# Patient Record
Sex: Male | Born: 1985 | Race: White | Hispanic: No | Marital: Single | Smoking: Never smoker
Health system: Southern US, Community
[De-identification: ages and names within clinical notes are randomized; demographics above are authoritative.]

## PROBLEM LIST (undated history)

## (undated) DIAGNOSIS — F32A Depression, unspecified: Secondary | ICD-10-CM

## (undated) DIAGNOSIS — F329 Major depressive disorder, single episode, unspecified: Secondary | ICD-10-CM

## (undated) HISTORY — PX: TONSILLECTOMY: SUR1361

---

## 2005-07-16 ENCOUNTER — Emergency Department (HOSPITAL_COMMUNITY): Admission: EM | Admit: 2005-07-16 | Discharge: 2005-07-16 | Payer: Self-pay | Admitting: Emergency Medicine

## 2006-06-09 ENCOUNTER — Emergency Department (HOSPITAL_COMMUNITY): Admission: EM | Admit: 2006-06-09 | Discharge: 2006-06-09 | Payer: Self-pay | Admitting: Emergency Medicine

## 2007-03-13 ENCOUNTER — Emergency Department: Payer: Self-pay | Admitting: Internal Medicine

## 2007-03-18 ENCOUNTER — Emergency Department (HOSPITAL_COMMUNITY): Admission: EM | Admit: 2007-03-18 | Discharge: 2007-03-19 | Payer: Self-pay | Admitting: Emergency Medicine

## 2007-11-09 ENCOUNTER — Ambulatory Visit: Payer: Self-pay | Admitting: Internal Medicine

## 2008-05-10 ENCOUNTER — Emergency Department: Payer: Self-pay | Admitting: Emergency Medicine

## 2008-05-14 ENCOUNTER — Emergency Department: Payer: Self-pay | Admitting: Emergency Medicine

## 2008-05-22 ENCOUNTER — Ambulatory Visit: Payer: Self-pay | Admitting: Internal Medicine

## 2011-06-06 ENCOUNTER — Emergency Department (HOSPITAL_COMMUNITY)
Admission: EM | Admit: 2011-06-06 | Discharge: 2011-06-06 | Disposition: A | Discharge status: Home / Self Care | Payer: No Typology Code available for payment source | Attending: Emergency Medicine | Admitting: Emergency Medicine

## 2011-06-06 ENCOUNTER — Encounter (HOSPITAL_COMMUNITY): Payer: Self-pay | Admitting: *Deleted

## 2011-06-06 ENCOUNTER — Emergency Department (HOSPITAL_COMMUNITY): Payer: No Typology Code available for payment source

## 2011-06-06 ENCOUNTER — Emergency Department (HOSPITAL_COMMUNITY): Payer: Self-pay

## 2011-06-06 DIAGNOSIS — Y92410 Unspecified street and highway as the place of occurrence of the external cause: Secondary | ICD-10-CM | POA: Insufficient documentation

## 2011-06-06 DIAGNOSIS — S39012A Strain of muscle, fascia and tendon of lower back, initial encounter: Secondary | ICD-10-CM

## 2011-06-06 DIAGNOSIS — S0003XA Contusion of scalp, initial encounter: Secondary | ICD-10-CM | POA: Insufficient documentation

## 2011-06-06 DIAGNOSIS — M542 Cervicalgia: Secondary | ICD-10-CM | POA: Insufficient documentation

## 2011-06-06 DIAGNOSIS — S161XXA Strain of muscle, fascia and tendon at neck level, initial encounter: Secondary | ICD-10-CM

## 2011-06-06 DIAGNOSIS — S335XXA Sprain of ligaments of lumbar spine, initial encounter: Secondary | ICD-10-CM | POA: Insufficient documentation

## 2011-06-06 DIAGNOSIS — S0990XA Unspecified injury of head, initial encounter: Secondary | ICD-10-CM | POA: Insufficient documentation

## 2011-06-06 MED ORDER — MORPHINE SULFATE 4 MG/ML IJ SOLN
6.0000 mg | Freq: Once | INTRAMUSCULAR | Status: AC
  Administered 2011-06-06: 6 mg via INTRAVENOUS

## 2011-06-06 MED ORDER — HYDROCODONE-ACETAMINOPHEN 5-500 MG PO TABS: 1.0000 | ORAL_TABLET | Freq: Four times a day (QID) | ORAL | Status: AC | PRN

## 2011-06-06 MED ORDER — MORPHINE SULFATE 4 MG/ML IJ SOLN
6.0000 mg | Freq: Once | INTRAMUSCULAR | Status: DC
  Filled 2011-06-06: qty 1

## 2011-06-06 MED ORDER — MORPHINE SULFATE 2 MG/ML IJ SOLN
INTRAMUSCULAR | Status: AC
  Filled 2011-06-06: qty 1

## 2011-06-06 MED ORDER — KETOROLAC TROMETHAMINE 30 MG/ML IJ SOLN
30.0000 mg | Freq: Once | INTRAMUSCULAR | Status: DC
  Filled 2011-06-06: qty 1

## 2011-06-06 MED ORDER — MORPHINE SULFATE 4 MG/ML IJ SOLN
6.0000 mg | Freq: Once | INTRAMUSCULAR | Status: AC
  Administered 2011-06-06: 6 mg via INTRAVENOUS
  Filled 2011-06-06: qty 2

## 2011-06-06 MED ORDER — TETANUS-DIPHTHERIA TOXOIDS TD 5-2 LFU IM INJ
0.5000 mL | INJECTION | Freq: Once | INTRAMUSCULAR | Status: AC
  Administered 2011-06-06: 0.5 mL via INTRAMUSCULAR
  Filled 2011-06-06: qty 0.5

## 2011-06-06 MED ORDER — KETOROLAC TROMETHAMINE 30 MG/ML IJ SOLN
30.0000 mg | Freq: Once | INTRAMUSCULAR | Status: AC
  Administered 2011-06-06: 30 mg via INTRAVENOUS

## 2011-06-06 MED ORDER — ONDANSETRON HCL 4 MG/2ML IJ SOLN
4.0000 mg | Freq: Once | INTRAMUSCULAR | Status: AC
  Administered 2011-06-06: 4 mg via INTRAVENOUS
  Filled 2011-06-06: qty 2

## 2011-06-06 MED ORDER — DIAZEPAM 5 MG PO TABS
5.0000 mg | ORAL_TABLET | Freq: Once | ORAL | Status: AC
  Administered 2011-06-06: 5 mg via ORAL
  Filled 2011-06-06: qty 1

## 2011-06-06 MED ORDER — ONDANSETRON 4 MG PO TBDP
4.0000 mg | ORAL_TABLET | Freq: Once | ORAL | Status: DC
  Administered 2011-06-06: 4 mg via ORAL
  Filled 2011-06-06: qty 1

## 2011-06-06 NOTE — ED Notes (Signed)
Drink and crackers given upon request
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
## 2011-06-06 NOTE — ED Notes (Signed)
SHP in with patient at this time

## 2011-06-06 NOTE — ED Notes (Signed)
Sat pt up in bed. Pt stated that his nausea and dizziness came back.

## 2011-06-06 NOTE — ED Notes (Signed)
Pt refuses to ambulate. States he does not feel it will be a good.
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
## 2011-06-06 NOTE — ED Notes (Signed)
Pt ambulated to bathroom, Pt stated he is sore Ambulated with no assistance.
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
## 2011-06-07 NOTE — ED Provider Notes (Signed)
Medical screening examination/treatment/procedure(s) were conducted as a shared visit with non-physician practitioner(s) and myself.  I personally evaluated the patient during the encounter  Olivia Mackie, MD 06/07/11 [phone]

## 2012-09-18 ENCOUNTER — Emergency Department: Payer: Self-pay | Admitting: Emergency Medicine

## 2012-11-26 ENCOUNTER — Emergency Department: Payer: Self-pay | Admitting: Emergency Medicine

## 2012-11-26 LAB — BASIC METABOLIC PANEL
BUN: 11 mg/dL (ref 7–18)
Glucose: 108 mg/dL — ABNORMAL HIGH (ref 65–99)
Osmolality: 277 (ref 275–301)
Potassium: 3.5 mmol/L (ref 3.5–5.1)

## 2012-11-26 LAB — CBC
HGB: 14.6 g/dL (ref 13.0–18.0)
MCHC: 33.7 g/dL (ref 32.0–36.0)
RBC: 4.9 10*6/uL (ref 4.40–5.90)
WBC: 10.4 10*3/uL (ref 3.8–10.6)

## 2012-11-27 LAB — HEPATIC FUNCTION PANEL A (ARMC)
Albumin: 4.1 g/dL (ref 3.4–5.0)
Bilirubin, Direct: <0.1 mg/dL (ref 0.00–0.20)
Bilirubin,Total: 0.2 mg/dL (ref 0.2–1.0)
Total Protein: 7.9 g/dL (ref 6.4–8.2)

## 2012-11-27 LAB — LIPASE, BLOOD: Lipase: 148 U/L (ref 73–393)

## 2013-02-16 ENCOUNTER — Emergency Department: Payer: Self-pay | Admitting: Emergency Medicine

## 2013-02-16 LAB — RAPID INFLUENZA A&B ANTIGENS (ARMC ONLY)

## 2013-03-19 ENCOUNTER — Emergency Department: Payer: Self-pay | Admitting: Emergency Medicine

## 2013-05-28 ENCOUNTER — Emergency Department: Payer: Self-pay | Admitting: Emergency Medicine

## 2013-06-17 ENCOUNTER — Emergency Department: Payer: Self-pay | Admitting: Emergency Medicine

## 2013-09-30 ENCOUNTER — Emergency Department: Payer: Self-pay | Admitting: Emergency Medicine

## 2013-10-05 ENCOUNTER — Emergency Department: Payer: Self-pay | Admitting: Emergency Medicine

## 2014-01-01 ENCOUNTER — Emergency Department: Payer: Self-pay | Admitting: Emergency Medicine

## 2014-08-15 IMAGING — CR DG CHEST 2V
1 series · 3 of 3 positions shown · non-contrast
Comparison: none

REASON FOR EXAM: Chest Pain
COMMENTS:

PROCEDURE:     DXR - DXR CHEST PA (OR AP) AND LATERAL  - November 26, 2012  [DATE]
RESULT:     The lungs are clear. The heart and pulmonary vessels are normal.
The bony and mediastinal structures are unremarkable. There is no effusion.
There is no pneumothorax or evidence of congestive failure.

[Series 1: pa · 0.17mm/px · 3 of 3 slices shown]
[im 1/3]
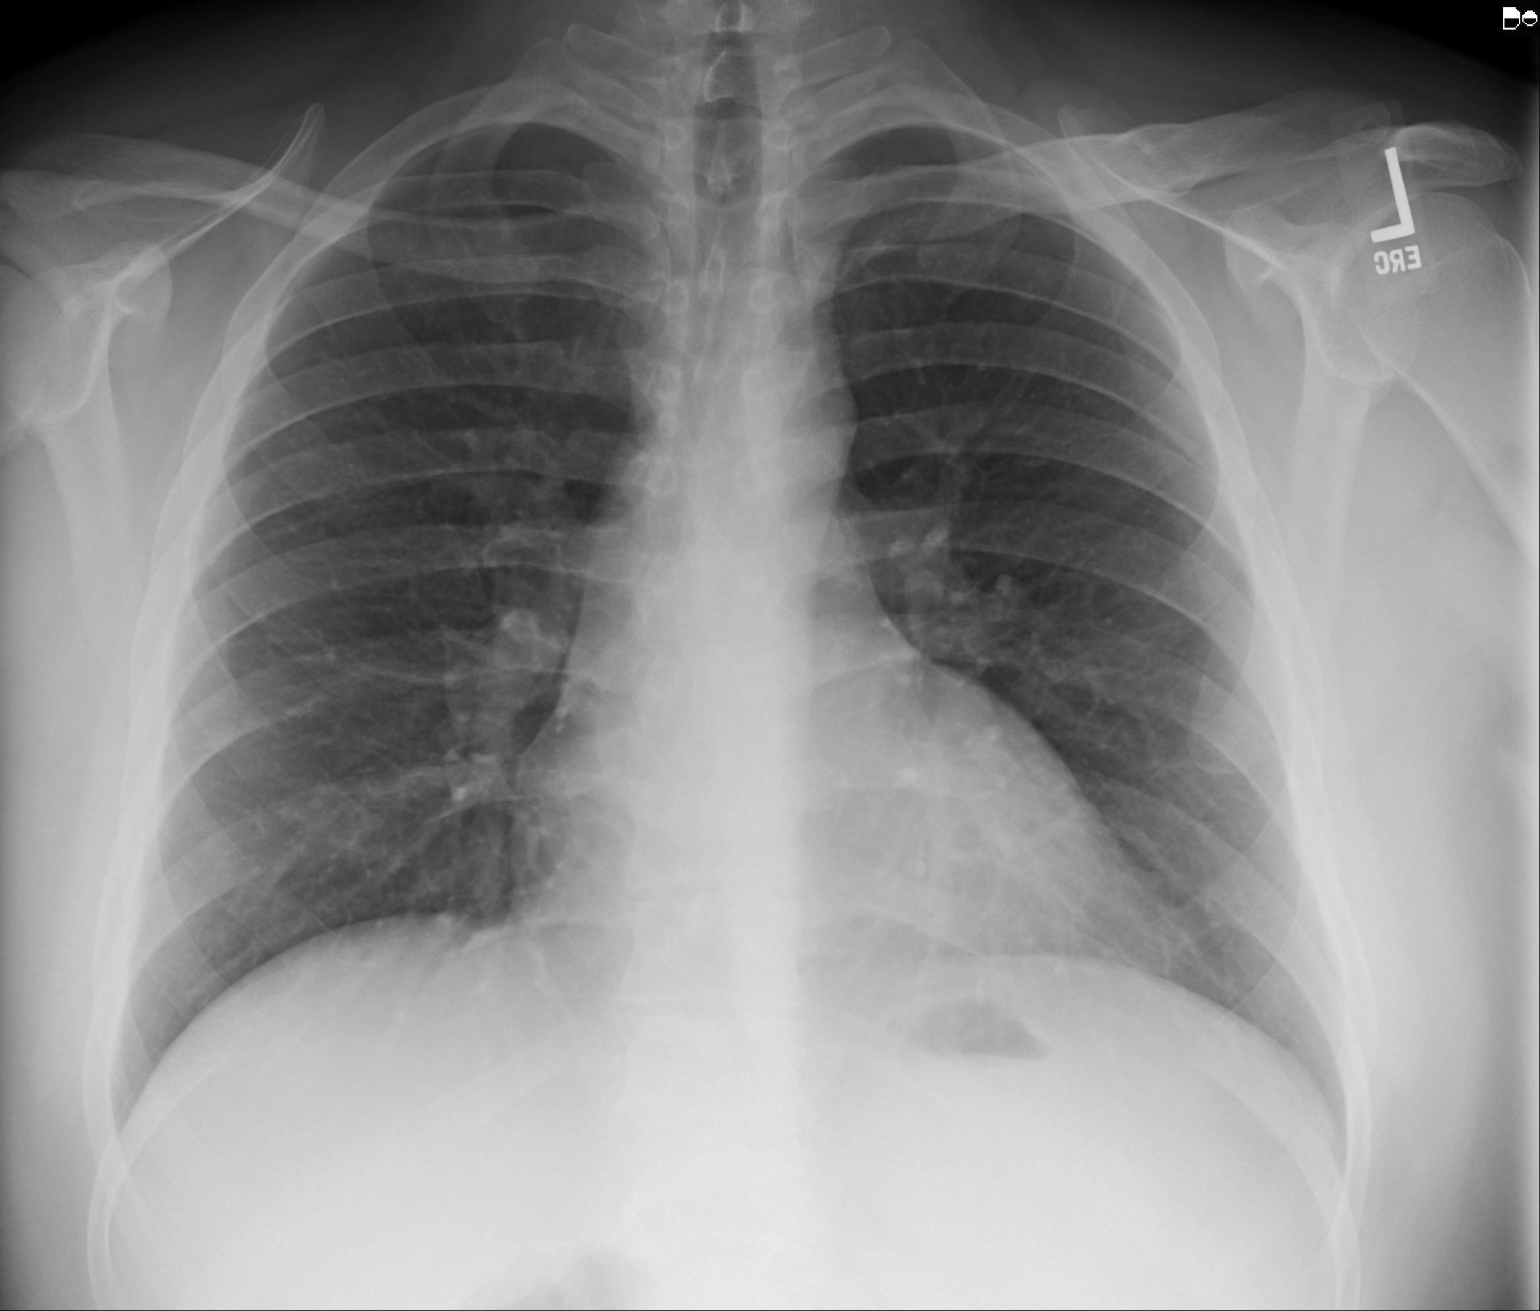
[im 2/3]
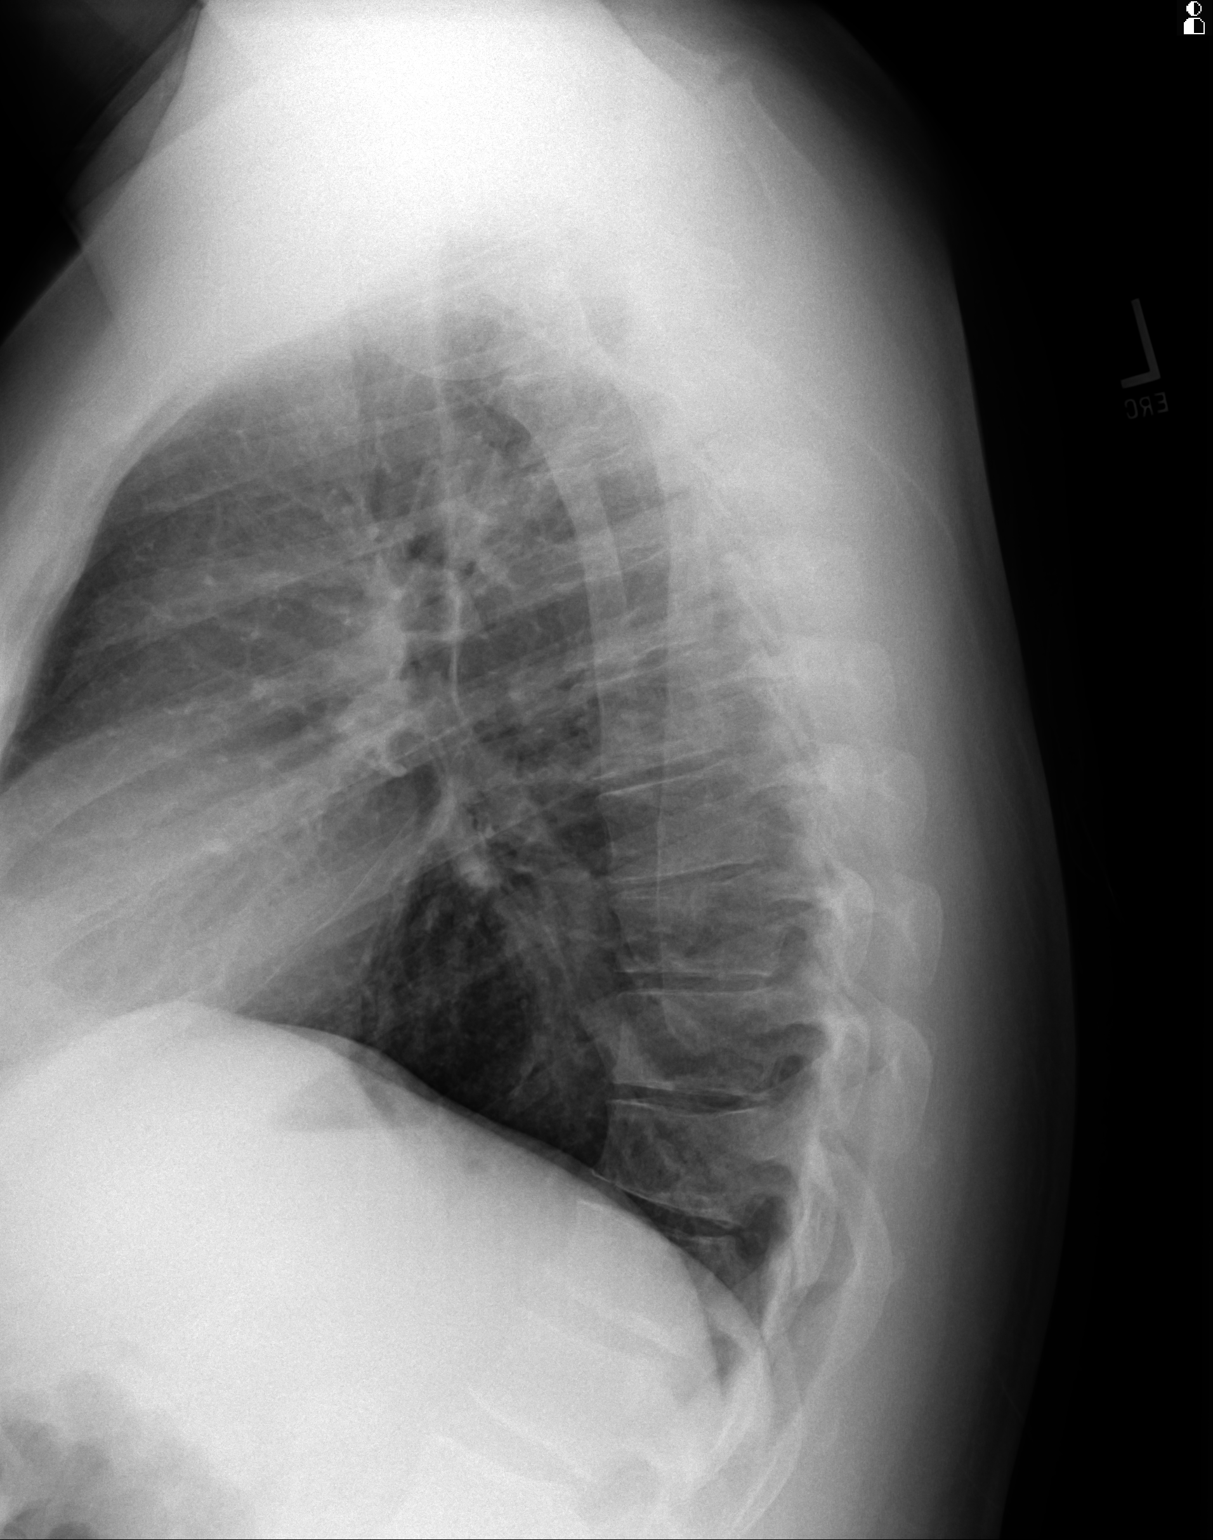
[im 3/3]
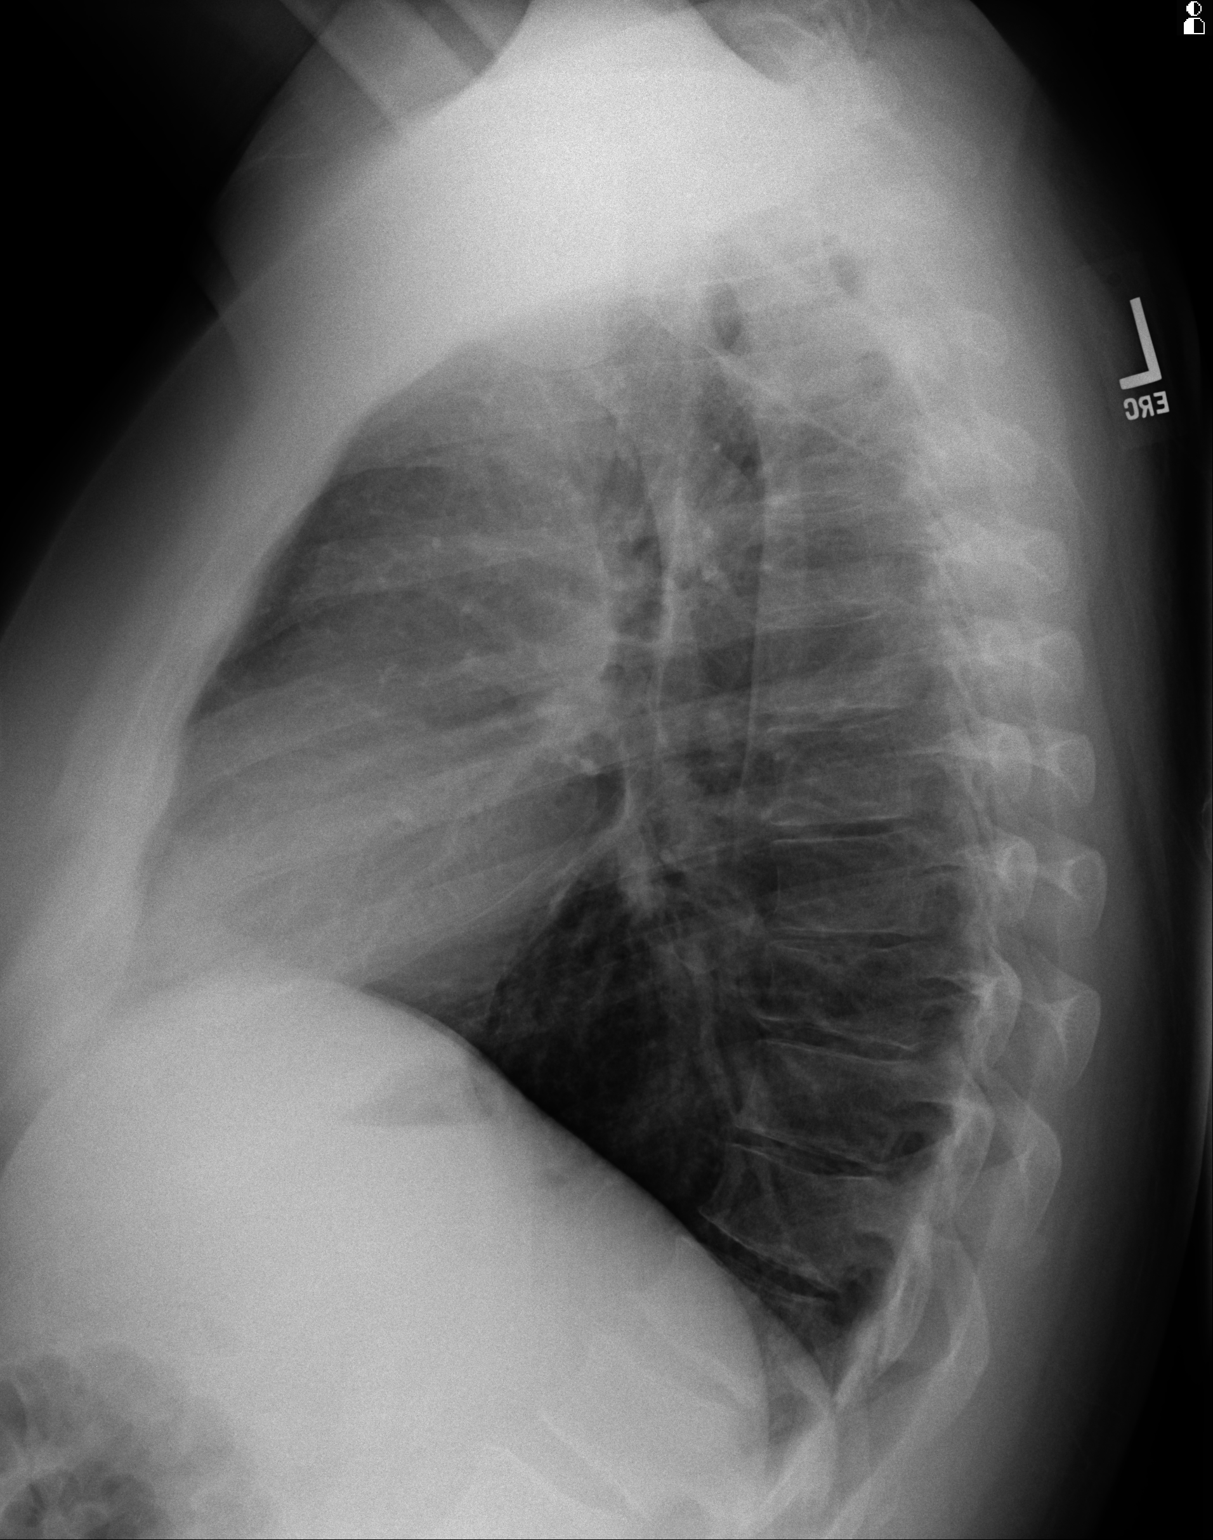

[3 of 3 positions shown; findings below may reference images not displayed]

IMPRESSION: No acute cardiopulmonary disease.

[REDACTED]

## 2015-03-30 ENCOUNTER — Encounter: Payer: Self-pay | Admitting: Emergency Medicine

## 2015-03-30 ENCOUNTER — Emergency Department
Admission: EM | Admit: 2015-03-30 | Discharge: 2015-03-30 | Disposition: A | Discharge status: Home / Self Care | Payer: BLUE CROSS/BLUE SHIELD | Attending: Emergency Medicine | Admitting: Emergency Medicine

## 2015-03-30 DIAGNOSIS — S39012A Strain of muscle, fascia and tendon of lower back, initial encounter: Secondary | ICD-10-CM | POA: Diagnosis not present

## 2015-03-30 DIAGNOSIS — Y9389 Activity, other specified: Secondary | ICD-10-CM | POA: Insufficient documentation

## 2015-03-30 DIAGNOSIS — Y9289 Other specified places as the place of occurrence of the external cause: Secondary | ICD-10-CM | POA: Insufficient documentation

## 2015-03-30 DIAGNOSIS — S3992XA Unspecified injury of lower back, initial encounter: Secondary | ICD-10-CM | POA: Diagnosis present

## 2015-03-30 DIAGNOSIS — Y998 Other external cause status: Secondary | ICD-10-CM | POA: Diagnosis not present

## 2015-03-30 DIAGNOSIS — X58XXXA Exposure to other specified factors, initial encounter: Secondary | ICD-10-CM | POA: Insufficient documentation

## 2015-03-30 MED ORDER — IBUPROFEN 800 MG PO TABS: 800.0000 mg | ORAL_TABLET | Freq: Three times a day (TID) | ORAL | Status: DC | PRN

## 2015-03-30 MED ORDER — KETOROLAC TROMETHAMINE 60 MG/2ML IM SOLN
60.0000 mg | Freq: Once | INTRAMUSCULAR | Status: AC
  Administered 2015-03-30: 60 mg via INTRAMUSCULAR
  Filled 2015-03-30: qty 2

## 2015-03-30 MED ORDER — OXYCODONE-ACETAMINOPHEN 5-325 MG PO TABS: 1.0000 | ORAL_TABLET | ORAL | Status: DC | PRN

## 2015-03-30 MED ORDER — CYCLOBENZAPRINE HCL 10 MG PO TABS: 10.0000 mg | ORAL_TABLET | Freq: Three times a day (TID) | ORAL | Status: DC | PRN

## 2015-03-30 NOTE — ED Notes (Signed)
Back pain with no injury recalled , hx of back issues , ambulatory with a slow gait

## 2015-03-30 NOTE — ED Notes (Signed)
AAOx3.  Skin warm and dry.  Ambulates with easy and steady gait.  MOving all extremities equally and strong.

## 2015-03-30 NOTE — Discharge Instructions (Signed)

## 2015-03-30 NOTE — ED Provider Notes (Signed)
[facility] Emergency Department Provider Note  ____________________________________________  Time seen: Approximately 2:48 PM  I have reviewed the triage vital signs and the nursing notes.   HISTORY  Chief Complaint Back Pain    HPI Steven Yoder is a 30 y.o. male who presents with a gradual onset of low back pain. Patient states previous history of back issues.. Denies any recent trauma that he is aware of. Started out yesterday with some mild nagging pain which progressively got worse through the day. Has tried icy hot patches, etc. for back pain, ibuprofen and Tylenol all to no relief. Denies any dysuria or urinary symptoms fever chills nausea or vomiting. Pain worse with any type of movement and has a little bit of easy and off when at rest. Currently rates his pain as 8/10 with radiation down both legs.   History reviewed. No pertinent past medical history.  There are no active problems to display for this patient.   History reviewed. No pertinent past surgical history.  Current Outpatient Rx  Name  Route  Sig  Dispense  Refill  . cyclobenzaprine (FLEXERIL) 10 MG tablet   Oral   Take 1 tablet (10 mg total) by mouth every 8 (eight) hours as needed for muscle spasms.   30 tablet   1   . ibuprofen (ADVIL,MOTRIN) 800 MG tablet   Oral   Take 1 tablet (800 mg total) by mouth every 8 (eight) hours as needed.   30 tablet   0   . oxyCODONE-acetaminophen (ROXICET) 5-325 MG tablet   Oral   Take 1-2 tablets by mouth every 4 (four) hours as needed for severe pain.   15 tablet   0     Allergies Review of patient's allergies indicates no known allergies.  No family history on file.  Social History Social History  Substance Use Topics  . Smoking status: Never Smoker   . Smokeless tobacco: None  . Alcohol Use: None    Review of Systems Constitutional: No fever/chills Eyes: No visual changes. ENT: No sore throat. Cardiovascular: Denies chest  pain. Respiratory: Denies shortness of breath. Gastrointestinal: No abdominal pain.  No nausea, no vomiting.  No diarrhea.  No constipation. Genitourinary: Negative for dysuria. Musculoskeletal: Positive for low back pain. Skin: Negative for rash. Neurological: Negative for headaches, focal weakness or numbness.  10-point ROS otherwise negative.  ____________________________________________   PHYSICAL EXAM:  VITAL SIGNS: ED Triage Vitals  Enc Vitals Group     BP 03/30/15 1336 144/74 mmHg     Pulse Rate 03/30/15 1336 77     Resp 03/30/15 1336 18     Temp 03/30/15 1336 98.7 F (37.1 C)     Temp Source 03/30/15 1336 Oral     SpO2 03/30/15 1336 100 %     Weight 03/30/15 1336 275 lb (124.739 kg)     Height 03/30/15 1336  (1.905 m)     Head Cir --      Peak Flow --      Pain Score 03/30/15 1336 8     Pain Loc --      Pain Edu? --      Excl. in GC? --     Constitutional: Alert and oriented. Well appearing and in no acute distress.   Cardiovascular: Normal rate, regular rhythm. Grossly normal heart sounds.  Good peripheral circulation. Respiratory: Normal respiratory effort.  No retractions. Lungs CTAB. Gastrointestinal: Soft and nontender. No distention. No abdominal bruits. No CVA tenderness.  Musculoskeletal: No lower extremity tenderness nor edema.  No joint effusions. Straight leg raise positive bilaterally at 20. Increased pain with moving to a sitting position. Neurologic:  Normal speech and language. No gross focal neurologic deficits are appreciated. No gait instability. Skin:  Skin is warm, dry and intact. No rash noted. Psychiatric: Mood and affect are normal. Speech and behavior are normal.  ____________________________________________   LABS (all labs ordered are listed, but only abnormal results are displayed)  Labs Reviewed - No data to display   PROCEDURES  Procedure(s) performed: None  Critical Care performed:  No  ____________________________________________   INITIAL IMPRESSION / ASSESSMENT AND PLAN / ED COURSE  Pertinent labs & imaging results that were available during my care of the patient were reviewed by me and considered in my medical decision making (see chart for details).  Acute low back pain. Treat with Toradol 60 mg IM while in the ED. Discharge home with Motrin 800 mg 3 times a day, Flexeril 10 mg 3 times a day. Will add Percocet 5/325 as needed for short-term acute pain only. Work excuse 24 hours given encouraged patient to follow up with his PCP for possible further orthopedic evaluation. ____________________________________________   FINAL CLINICAL IMPRESSION(S) / ED DIAGNOSES  Final diagnoses:  Lumbar strain, initial encounter     This chart was dictated using voice recognition software/Dragon. Despite best efforts to proofread, errors can occur which can change the meaning. Any change was purely unintentional.   Evangeline Dakin, PA-C 03/30/15 1522  Jene Every, MD 03/30/15 [phone]

## 2015-05-06 ENCOUNTER — Emergency Department: Payer: BLUE CROSS/BLUE SHIELD

## 2015-05-06 ENCOUNTER — Emergency Department
Admission: EM | Admit: 2015-05-06 | Discharge: 2015-05-06 | Disposition: A | Discharge status: Home / Self Care | Payer: BLUE CROSS/BLUE SHIELD | Attending: Emergency Medicine | Admitting: Emergency Medicine

## 2015-05-06 ENCOUNTER — Encounter: Payer: Self-pay | Admitting: Emergency Medicine

## 2015-05-06 DIAGNOSIS — M545 Low back pain: Secondary | ICD-10-CM | POA: Diagnosis present

## 2015-05-06 DIAGNOSIS — M5441 Lumbago with sciatica, right side: Secondary | ICD-10-CM | POA: Diagnosis not present

## 2015-05-06 DIAGNOSIS — M5431 Sciatica, right side: Secondary | ICD-10-CM

## 2015-05-06 MED ORDER — METHOCARBAMOL 750 MG PO TABS: 1500.0000 mg | ORAL_TABLET | Freq: Four times a day (QID) | ORAL | Status: DC

## 2015-05-06 MED ORDER — DEXAMETHASONE SODIUM PHOSPHATE 10 MG/ML IJ SOLN
INTRAMUSCULAR | Status: AC
  Filled 2015-05-06: qty 1

## 2015-05-06 MED ORDER — DEXAMETHASONE SODIUM PHOSPHATE 10 MG/ML IJ SOLN
10.0000 mg | Freq: Once | INTRAMUSCULAR | Status: AC
  Administered 2015-05-06: 10 mg via INTRAMUSCULAR

## 2015-05-06 MED ORDER — METHYLPREDNISOLONE 4 MG PO TBPK: ORAL_TABLET | ORAL | Status: DC

## 2015-05-06 MED ORDER — OXYCODONE-ACETAMINOPHEN 5-325 MG PO TABS: 1.0000 | ORAL_TABLET | Freq: Four times a day (QID) | ORAL | Status: DC | PRN

## 2015-05-06 MED ORDER — KETOROLAC TROMETHAMINE 60 MG/2ML IM SOLN
60.0000 mg | Freq: Once | INTRAMUSCULAR | Status: AC
  Administered 2015-05-06: 60 mg via INTRAMUSCULAR
  Filled 2015-05-06: qty 2

## 2015-05-06 NOTE — ED Provider Notes (Signed)
[facility] Emergency Department Provider Note  ____________________________________________  Time seen: Approximately 11:44 AM  I have reviewed the triage vital signs and the nursing notes.   HISTORY  Chief Complaint Back Pain    HPI AVARI GELLES is a 30 y.o. male patient complain chronic back pain. Onset of radicular component to the right lower leg this morning. Patient last seen this complaint in February of this year. Patient denies any bladder or bowel dysfunction. No palliative measures taken for this complaint. Patient is to follow with orthopedic Dr. as directed for/ER visit. Patient states scheduled to see his family doctor next month with and or physical was hoping to wait until then to discuss his back pain. Patient rates his pain discomfort as a 5/10. Patient state pain presents with movement and ambulation. Pain decreased with laying supine. History reviewed. No pertinent past medical history.  There are no active problems to display for this patient.   History reviewed. No pertinent past surgical history.  Current Outpatient Rx  Name  Route  Sig  Dispense  Refill  . cyclobenzaprine (FLEXERIL) 10 MG tablet   Oral   Take 1 tablet (10 mg total) by mouth every 8 (eight) hours as needed for muscle spasms.   30 tablet   1   . ibuprofen (ADVIL,MOTRIN) 800 MG tablet   Oral   Take 1 tablet (800 mg total) by mouth every 8 (eight) hours as needed.   30 tablet   0   . methocarbamol (ROBAXIN-750) 750 MG tablet   Oral   Take 2 tablets (1,500 mg total) by mouth 4 (four) times daily.   40 tablet   0   . methylPREDNISolone (MEDROL DOSEPAK) 4 MG TBPK tablet      Take Tapered dose as directed   21 tablet   0   . oxyCODONE-acetaminophen (ROXICET) 5-325 MG tablet   Oral   Take 1-2 tablets by mouth every 4 (four) hours as needed for severe pain.   15 tablet   0   . oxyCODONE-acetaminophen (ROXICET) 5-325 MG tablet   Oral   Take 1 tablet by mouth  every 6 (six) hours as needed for moderate pain.   12 tablet   0     Allergies Review of patient's allergies indicates no known allergies.  No family history on file.  Social History Social History  Substance Use Topics  . Smoking status: Never Smoker   . Smokeless tobacco: None  . Alcohol Use: None    Review of Systems Constitutional: No fever/chills Eyes: No visual changes. ENT: No sore throat. Cardiovascular: Denies chest pain. Respiratory: Denies shortness of breath. Gastrointestinal: No abdominal pain.  No nausea, no vomiting.  No diarrhea.  No constipation. Genitourinary: Negative for dysuria. Musculoskeletal: Chronic back pain  Skin: Negative for rash. Neurological: Negative for headaches, focal weakness or numbness. 10-point ROS otherwise negative.  ____________________________________________   PHYSICAL EXAM:  VITAL SIGNS: ED Triage Vitals  Enc Vitals Group     BP 05/06/15 1143 134/94 mmHg     Pulse Rate 05/06/15 1143 77     Resp 05/06/15 1143 18     Temp 05/06/15 1143 98.5 F (36.9 C)     Temp src --      SpO2 05/06/15 1143 99 %     Weight 05/06/15 1143 275 lb (124.739 kg)     Height 05/06/15 1143  (1.905 m)     Head Cir --      Peak  Flow --      Pain Score --      Pain Loc --      Pain Edu? --      Excl. in GC? --     Constitutional: Alert and oriented. Well appearing and in no acute distress. Eyes: Conjunctivae are normal. PERRL. EOMI. Head: Atraumatic. Nose: No congestion/rhinnorhea. Mouth/Throat: Mucous membranes are moist.  Oropharynx non-erythematous. Neck: No stridor.  No cervical spine tenderness to palpation. Hematological/Lymphatic/Immunilogical: No cervical lymphadenopathy. Cardiovascular: Normal rate, regular rhythm. Grossly normal heart sounds.  Good peripheral circulation. Respiratory: Normal respiratory effort.  No retractions. Lungs CTAB. Gastrointestinal: Soft and nontender. No distention. No abdominal bruits. No CVA  tenderness. Musculoskeletal: No deformity of the L-spine. No CVA gotten. Patient has decreased range of motion with flexion with complaint of pain and 80. Patient has negative straight leg test. Patient had normal gait. Neurologic:  Normal speech and language. No gross focal neurologic deficits are appreciated. No gait instability. Skin:  Skin is warm, dry and intact. No rash noted. Psychiatric: Mood and affect are normal. Speech and behavior are normal.  ____________________________________________   LABS (all labs ordered are listed, but only abnormal results are displayed)  Labs Reviewed - No data to display ____________________________________________  EKG   ____________________________________________  RADIOLOGY  No acute findings on x-ray. ____________________________________________   PROCEDURES  Procedure(s) performed: None  Critical Care performed: No  ____________________________________________   INITIAL IMPRESSION / ASSESSMENT AND PLAN / ED COURSE  Pertinent labs & imaging results that were available during my care of the patient were reviewed by me and considered in my medical decision making (see chart for details).  Mild sciatica to the right lower extremity. Discussed x-ray finding with patient. Given discharge care instructions. Patient given prescription for medrol dosepak and Percocet. Patient advised follow-up with scheduled doctor's appointment next month. ____________________________________________   FINAL CLINICAL IMPRESSION(S) / ED DIAGNOSES  Final diagnoses:  Sciatica associated with disorder of lumbar spine, right      Joni ReiningRonald K Caydin Yeatts, PA-C 05/06/15 1311  Jene Everyobert Kinner, MD 05/06/15 [phone]
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
## 2015-06-05 ENCOUNTER — Other Ambulatory Visit: Payer: Self-pay | Admitting: Internal Medicine

## 2015-06-05 DIAGNOSIS — M5136 Other intervertebral disc degeneration, lumbar region: Secondary | ICD-10-CM

## 2015-06-20 ENCOUNTER — Ambulatory Visit
Admission: RE | Admit: 2015-06-20 | Discharge: 2015-06-20 | Disposition: A | Discharge status: Home / Self Care | Payer: BLUE CROSS/BLUE SHIELD | Source: Ambulatory Visit | Attending: Internal Medicine | Admitting: Internal Medicine

## 2015-06-20 DIAGNOSIS — M5127 Other intervertebral disc displacement, lumbosacral region: Secondary | ICD-10-CM | POA: Insufficient documentation

## 2015-06-20 DIAGNOSIS — M5136 Other intervertebral disc degeneration, lumbar region: Secondary | ICD-10-CM

## 2015-08-13 ENCOUNTER — Encounter: Payer: Self-pay | Admitting: *Deleted

## 2015-08-13 ENCOUNTER — Emergency Department
Admission: EM | Admit: 2015-08-13 | Discharge: 2015-08-13 | Disposition: A | Discharge status: Home / Self Care | Payer: No Typology Code available for payment source | Attending: Student | Admitting: Student

## 2015-08-13 ENCOUNTER — Emergency Department: Payer: No Typology Code available for payment source

## 2015-08-13 DIAGNOSIS — M10072 Idiopathic gout, left ankle and foot: Secondary | ICD-10-CM | POA: Insufficient documentation

## 2015-08-13 DIAGNOSIS — M109 Gout, unspecified: Secondary | ICD-10-CM

## 2015-08-13 LAB — CBC WITH DIFFERENTIAL/PLATELET
Basophils Absolute: 0 10*3/uL (ref 0–0.1)
Basophils Relative: 0 %
Eosinophils Absolute: 0.3 10*3/uL (ref 0–0.7)
Eosinophils Relative: 4 %
HCT: 36.3 % — ABNORMAL LOW (ref 40.0–52.0)
Hemoglobin: 11.7 g/dL — ABNORMAL LOW (ref 13.0–18.0)
Lymphocytes Relative: 15 %
Lymphs Abs: 1.3 10*3/uL (ref 1.0–3.6)
MCH: 24.4 pg — ABNORMAL LOW (ref 26.0–34.0)
MCHC: 32.1 g/dL (ref 32.0–36.0)
MCV: 76.1 fL — ABNORMAL LOW (ref 80.0–100.0)
Monocytes Absolute: 0.6 10*3/uL (ref 0.2–1.0)
Monocytes Relative: 8 %
Neutro Abs: 6 10*3/uL (ref 1.4–6.5)
Neutrophils Relative %: 73 %
Platelets: 195 10*3/uL (ref 150–440)
RBC: 4.77 MIL/uL (ref 4.40–5.90)
RDW: 16.6 % — ABNORMAL HIGH (ref 11.5–14.5)
WBC: 8.1 10*3/uL (ref 3.8–10.6)

## 2015-08-13 LAB — BASIC METABOLIC PANEL
Anion gap: 6 (ref 5–15)
BUN: 9 mg/dL (ref 6–20)
CO2: 27 mmol/L (ref 22–32)
Calcium: 9.2 mg/dL (ref 8.9–10.3)
Chloride: 105 mmol/L (ref 101–111)
Creatinine, Ser: 0.94 mg/dL (ref 0.61–1.24)
GFR calc Af Amer: >60 mL/min (ref >60)
GLUCOSE: 98 mg/dL (ref 65–99)
POTASSIUM: 3.8 mmol/L (ref 3.5–5.1)
Sodium: 138 mmol/L (ref 135–145)

## 2015-08-13 LAB — URIC ACID: Uric Acid, Serum: 9.8 mg/dL — ABNORMAL HIGH (ref 4.4–7.6)

## 2015-08-13 MED ORDER — INDOMETHACIN 50 MG PO CAPS: 50.0000 mg | ORAL_CAPSULE | Freq: Three times a day (TID) | ORAL | Status: DC | PRN

## 2015-08-13 MED ORDER — COLCHICINE 0.6 MG PO TABS
0.6000 mg | ORAL_TABLET | Freq: Every day | ORAL | Status: DC
Start: 2015-08-13 — End: 2017-02-01

## 2015-08-13 NOTE — ED Notes (Signed)
See triage note  left foot pain with some swelling  No injury

## 2015-08-13 NOTE — ED Provider Notes (Signed)
[facility] Emergency Department Provider Note  ____________________________________________  Time seen: Approximately 10:57 AM  I have reviewed the triage vital signs and the nursing notes.   HISTORY  Chief Complaint Foot Pain    HPI Steven Yoder is a 30 y.o. male , NAD, presents to the emergency department with several hour history of left ankle pain and swelling. States he woke overnight with severe pain and swelling in the left ankle radiating into the foot. Also notes at that time he had a sharp quick pain behind the left knee in which caused him not to be able to extend the knee but that has since resolved. Denies injuries, traumas, falls. No numbness, weakness or tingling. No rash, skin sores or open wounds to the left lower extremity. Does note that the ankle feels warm to touch. Admits to positive family history for gout but no personal history of such. Denies any family history or personal history of DVT, PE, bleeding or clotting disorders. Has not had any chest pain, shortness of breath, visual changes nor headache. Has not had any fevers, chills, body aches, abdominal pain, nausea, vomiting.   History reviewed. No pertinent past medical history.  There are no active problems to display for this patient.   History reviewed. No pertinent past surgical history.  Current Outpatient Rx  Name  Route  Sig  Dispense  Refill  . colchicine 0.6 MG tablet   Oral   Take 1 tablet (0.6 mg total) by mouth daily. Take 2 tablets at onset of pain, then may take 1 more 1 hour later if pain continues.   6 tablet   0   . indomethacin (INDOCIN) 50 MG capsule   Oral   Take 1 capsule (50 mg total) by mouth 3 (three) times daily with meals as needed.   21 capsule   0     Allergies Review of patient's allergies indicates no known allergies.  No family history on file.  Social History Social History  Substance Use Topics  . Smoking status: Never Smoker   .  Smokeless tobacco: None  . Alcohol Use: No     Review of Systems  Constitutional: No fever/chills, fatigue Eyes: No visual changes.  Cardiovascular: No chest pain. Respiratory: No cough. No shortness of breath. No wheezing.  Gastrointestinal: No abdominal pain.  No nausea, vomiting.  Musculoskeletal: Positive left ankle pain and swelling. Positive left knee pain that has resolved. Negative for back, hip pain.  Skin: Positive redness, swelling, warmth to left ankle. Negative for rash. Neurological: Negative for headaches, focal weakness or numbness. No tingling. 10-point ROS otherwise negative.  ____________________________________________   PHYSICAL EXAM:  VITAL SIGNS: ED Triage Vitals  Enc Vitals Group     BP 08/13/15 1041 136/86 mmHg     Pulse Rate 08/13/15 1041 82     Resp 08/13/15 1041 20     Temp 08/13/15 1041 98.5 F (36.9 C)     Temp Source 08/13/15 1041 Oral     SpO2 08/13/15 1041 96 %     Weight 08/13/15 1041 285 lb (129.275 kg)     Height 08/13/15 1041 6\' 2"  (1.88 m)     Head Cir --      Peak Flow --      Pain Score 08/13/15 1037 9     Pain Loc --      Pain Edu? --      Excl. in GC? --      Constitutional: Alert  and oriented. Well appearing and in no acute distress. Eyes: Conjunctivae are normal.  Head: Atraumatic. Cardiovascular: Good peripheral circulation with 2+ pulses noted in the right lower extremity. Capillary refill is brisk in all digits of the right foot Respiratory: Normal respiratory effort without tachypnea or retractions.  Musculoskeletal: Left distal lower leg, left ankle and proximal left foot diffusely swollen and with trace edema that is nonpitting. Pain with flexion of the left ankle. Strength of the left foot and ankle equal and 5 out of 5 as compared to the right foot and ankle. No laxity with anterior or posterior drawer of the left ankle or knee. No laxity with varus or valgus stress of the left ankle or knee. Full range of motion of  the left knee without pain. No masses or cysts noted to palpation of the posterior knee. No joint effusions. Neurologic:  Normal speech and language. No gross focal neurologic deficits are appreciated. Sensation to light touch about bilateral lower extremities grossly intact. Skin:  Skin about left ankle is warm to the touch as compared to the right. No open wounds, skin sores, bruising is noted. Skin is warm, dry and intact. No rash noted. Psychiatric: Mood and affect are normal. Speech and behavior are normal. Patient exhibits appropriate insight and judgement.   ____________________________________________   LABS (all labs ordered are listed, but only abnormal results are displayed)  Labs Reviewed  CBC WITH DIFFERENTIAL/PLATELET - Abnormal; Notable for the following:    Hemoglobin 11.7 (*)    HCT 36.3 (*)    MCV 76.1 (*)    MCH 24.4 (*)    RDW 16.6 (*)    All other components within normal limits  URIC ACID - Abnormal; Notable for the following:    Uric Acid, Serum 9.8 (*)    All other components within normal limits  BASIC METABOLIC PANEL   ____________________________________________  EKG  None ____________________________________________  RADIOLOGY I have personally viewed and evaluated these images (plain radiographs) as part of my medical decision making, as well as reviewing the written report by the radiologist.  Dg Ankle Complete Left  08/13/2015  CLINICAL DATA:  Pain and swelling.  No injury EXAM: LEFT ANKLE COMPLETE - 3+ VIEW COMPARISON:  06/09/2006 FINDINGS: Normal joint space.  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 Small joint effusion. Mild degenerative change in the midfoot. IMPRESSION: Chronic injury medial malleolus. Small joint effusion. No acute fracture. Electronically Signed   By: Marlan Palau M.D.   On: 08/13/2015 11:29   US Venous Img Lower Unilateral Left  08/13/2015   CLINICAL DATA:  Left lower extremity pain and edema for the past day. History of smoking. Evaluate for DVT. EXAM: LEFT LOWER EXTREMITY VENOUS DOPPLER ULTRASOUND TECHNIQUE: Gray-scale sonography with graded compression, as well as color Doppler and duplex ultrasound were performed to evaluate the lower extremity deep venous systems from the level of the common femoral vein and including the common femoral, femoral, profunda femoral, popliteal and calf veins including the posterior tibial, peroneal and gastrocnemius veins when visible. The superficial great saphenous vein was also interrogated. Spectral Doppler was utilized to evaluate flow at rest and with distal augmentation maneuvers in the common femoral, femoral and popliteal veins. COMPARISON:  None. FINDINGS: Contralateral Common Femoral Vein: Respiratory phasicity is normal and symmetric with the symptomatic side. No evidence of thrombus. Normal compressibility. Common Femoral Vein: No evidence of thrombus. Normal compressibility, respiratory phasicity and response to  augmentation. Saphenofemoral Junction: No evidence of thrombus. Normal compressibility and flow on color Doppler imaging. Profunda Femoral Vein: No evidence of thrombus. Normal compressibility and flow on color Doppler imaging. Femoral Vein: No evidence of thrombus. Normal compressibility, respiratory phasicity and response to augmentation. Popliteal Vein: No evidence of thrombus. Normal compressibility, respiratory phasicity and response to augmentation. Calf Veins: No evidence of thrombus. Normal compressibility and flow on color Doppler imaging. Superficial Great Saphenous Vein: No evidence of thrombus. Normal compressibility and flow on color Doppler imaging. Venous Reflux:  None. Other Findings:  None. IMPRESSION: No evidence of DVT within the left lower extremity. Electronically Signed   By: Simonne Come M.D.   On: 08/13/2015 11:41     ____________________________________________    PROCEDURES  Procedure(s) performed: None    Medications - No data to display   ____________________________________________   INITIAL IMPRESSION / ASSESSMENT AND PLAN / ED COURSE  Pertinent labs & imaging results that were available during my care of the patient were reviewed by me and considered in my medical decision making (see chart for details).  Patient's diagnosis is consistent with acute gout of the left ankle. Patient will be discharged home with prescriptions for colchicine and indomethacin to take as directed. Patient is to apply ice to the affected area 20 menstrual to 4 times daily and keep left lower extremity elevated when not ambulating. Work note was given to excuse the patient for work today and tomorrow to allow healing and decrease of pain. Patient is to follow up with his primary care provider if symptoms persist past this treatment course as well as in 2 weeks for repeat uric acid. Patient is given ED precautions to return to the ED for any worsening or new symptoms.    ____________________________________________  FINAL CLINICAL IMPRESSION(S) / ED DIAGNOSES  Final diagnoses:  Acute gout of left ankle, unspecified cause      NEW MEDICATIONS STARTED DURING THIS VISIT:  Discharge Medication List as of 08/13/2015 12:23 PM    START taking these medications   Details  colchicine 0.6 MG tablet Take 1 tablet (0.6 mg total) by mouth daily. Take 2 tablets at onset of pain, then may take 1 more 1 hour later if pain continues., Starting 08/13/2015, Until Discontinued, Print    indomethacin (INDOCIN) 50 MG capsule Take 1 capsule (50 mg total) by mouth 3 (three) times daily with meals as needed., Starting 08/13/2015, Until Discontinued, Print             Hope Pigeon, PA-C 08/13/15 1451  Gayla Doss, MD 08/13/15 1553

## 2015-08-13 NOTE — Discharge Instructions (Signed)

## 2015-08-13 NOTE — ED Notes (Signed)
Pt complains of left foot pain, pt denies an injury to foot

## 2016-01-11 ENCOUNTER — Emergency Department
Admission: EM | Admit: 2016-01-11 | Discharge: 2016-01-11 | Disposition: A | Discharge status: Home / Self Care | Payer: BLUE CROSS/BLUE SHIELD | Attending: Emergency Medicine | Admitting: Emergency Medicine

## 2016-01-11 ENCOUNTER — Encounter: Payer: Self-pay | Admitting: Medical Oncology

## 2016-01-11 DIAGNOSIS — M5442 Lumbago with sciatica, left side: Secondary | ICD-10-CM | POA: Insufficient documentation

## 2016-01-11 DIAGNOSIS — Z79899 Other long term (current) drug therapy: Secondary | ICD-10-CM | POA: Insufficient documentation

## 2016-01-11 DIAGNOSIS — M545 Low back pain: Secondary | ICD-10-CM | POA: Diagnosis present

## 2016-01-11 HISTORY — DX: Depression, unspecified: F32.A

## 2016-01-11 HISTORY — DX: Major depressive disorder, single episode, unspecified: F32.9

## 2016-01-11 MED ORDER — ORPHENADRINE CITRATE 30 MG/ML IJ SOLN
60.0000 mg | Freq: Once | INTRAMUSCULAR | Status: AC
  Administered 2016-01-11: 60 mg via INTRAMUSCULAR
  Filled 2016-01-11: qty 2

## 2016-01-11 MED ORDER — KETOROLAC TROMETHAMINE 60 MG/2ML IM SOLN
60.0000 mg | Freq: Once | INTRAMUSCULAR | Status: AC
  Administered 2016-01-11: 60 mg via INTRAMUSCULAR
  Filled 2016-01-11: qty 2

## 2016-01-11 MED ORDER — MELOXICAM 15 MG PO TABS: 15.0000 mg | ORAL_TABLET | Freq: Every day | ORAL | 0 refills | Status: AC

## 2016-01-11 NOTE — ED Provider Notes (Signed)
[facility] Emergency Department Provider Note  ____________________________________________  Time seen: Approximately 5:19 PM  I have reviewed the triage vital signs and the nursing notes.   HISTORY  Chief Complaint Back Pain    HPI Steven Yoder is a 30 y.o. male that presents with low left back pain for 3 days. Pain begins and lower back and radiates into left buttocks. Patient states the pain is sharp. Patient has had low back pain in the past but nothing similar to this. Patient took a muscle relaxer last night for symptoms, which helped a bit. Patient denies fever, incontinence, numbness, tingling, difficulty walking.   Past Medical History:  Diagnosis Date  . Depression     There are no active problems to display for this patient.   History reviewed. No pertinent surgical history.  Prior to Admission medications   Medication Sig Start Date End Date Taking? Authorizing Provider  colchicine 0.6 MG tablet Take 1 tablet (0.6 mg total) by mouth daily. Take 2 tablets at onset of pain, then may take 1 more 1 hour later if pain continues. 08/13/15   Jami L Hagler, PA-C  indomethacin (INDOCIN) 50 MG capsule Take 1 capsule (50 mg total) by mouth 3 (three) times daily with meals as needed. 08/13/15   Jami L Hagler, PA-C  meloxicam (MOBIC) 15 MG tablet Take 1 tablet (15 mg total) by mouth daily. 01/11/16 01/21/16  Enid DerryAshley Folashade Gamboa, PA-C    Allergies Patient has no known allergies.  No family history on file.  Social History Social History  Substance Use Topics  . Smoking status: Never Smoker  . Smokeless tobacco: Not on file  . Alcohol use No     Review of Systems  Constitutional: No fever/chills ENT: No upper respiratory complaints. Cardiovascular: no chest pain. Respiratory: no cough. No SOB. Gastrointestinal: No abdominal pain.  No nausea, no vomiting.  No diarrhea.  No constipation. Genitourinary: Negative for dysuria.  Skin: Negative for rash,  abrasions, lacerations, ecchymosis. Neurological: Negative for headaches, focal weakness or numbness.   ____________________________________________   PHYSICAL EXAM:  VITAL SIGNS: ED Triage Vitals  Enc Vitals Group     BP 01/11/16 1622 140/85     Pulse Rate 01/11/16 1622 86     Resp 01/11/16 1622 16     Temp 01/11/16 1622 98.2 F (36.8 C)     Temp Source 01/11/16 1622 Oral     SpO2 01/11/16 1622 97 %     Weight 01/11/16 1617 285 lb (129.3 kg)     Height --      Head Circumference --      Peak Flow --      Pain Score 01/11/16 1617 8     Pain Loc --      Pain Edu? --      Excl. in GC? --      Constitutional: Alert and oriented. Well appearing and in no acute distress. Eyes: Conjunctivae are normal.  Head: Atraumatic. ENT:      Ears:       Nose: No congestion/rhinnorhea.      Mouth/Throat: Mucous membranes are moist.  Neck: No stridor.  Cardiovascular: Normal rate, regular rhythm. Normal S1 and S2.  Good peripheral circulation. Respiratory: Normal respiratory effort without tachypnea or retractions. Lungs CTAB. Good air entry to the bases with no decreased or absent breath sounds. Musculoskeletal: Full range of motion to all extremities. No gross deformities appreciated. Positive straight leg raise, cross leg raise, faber 4. Pain  localized at left SI joint. Mild tenderness to palpation at left SI joint. Neurologic:  Normal speech and language. No gross focal neurologic deficits are appreciated.  Skin:  Skin is warm, dry and intact. No rash noted. Psychiatric: Mood and affect are normal. Speech and behavior are normal. Patient exhibits appropriate insight and judgement.   ____________________________________________   LABS (all labs ordered are listed, but only abnormal results are displayed)  Labs Reviewed - No data to display ____________________________________________  EKG   ____________________________________________  RADIOLOGY  No results  found.  ____________________________________________    PROCEDURES  Procedure(s) performed:    Procedures    Medications  orphenadrine (NORFLEX) injection 60 mg (60 mg Intramuscular Given 01/11/16 1739)  ketorolac (TORADOL) injection 60 mg (60 mg Intramuscular Given 01/11/16 1739)     ____________________________________________   INITIAL IMPRESSION / ASSESSMENT AND PLAN / ED COURSE  Pertinent labs & imaging results that were available during my care of the patient were reviewed by me and considered in my medical decision making (see chart for details).  Review of the [place] CSRS was performed in accordance of the [facility] prior to dispensing any controlled drugs.  Clinical Course     Patient's diagnosis is consistent with left low back pain with sciatica. Patient was given Norflex and Toradol in the emergency department, which helped. No indication for x-ray for back pain of 3 days without trauma. Patient will be discharged home with prescriptions for meloxicam. Patient is to follow up with PCP as needed or otherwise directed. Patient is given ED precautions to return to the ED for any worsening or new symptoms. No evidence of cauda hematoma, vascular catastrophe, spinal abscess/hematoma, or referred intra-abdominal pain. All patients questions questions were answered.    ____________________________________________  FINAL CLINICAL IMPRESSION(S) / ED DIAGNOSES  Final diagnoses:  Acute left-sided low back pain with left-sided sciatica      NEW MEDICATIONS STARTED DURING THIS VISIT:  Discharge Medication List as of 01/11/2016  5:48 PM    START taking these medications   Details  meloxicam (MOBIC) 15 MG tablet Take 1 tablet (15 mg total) by mouth daily., Starting Sun 01/11/2016, Until Wed 01/21/2016, Print            This chart was dictated using voice recognition software/Dragon. Despite best efforts to proofread, errors can occur which can change the meaning. Any  change was purely unintentional.   Enid DerryAshley Launi Asencio, PA-C 01/11/16 1815    Sharyn CreamerMark Quale, MD 01/18/16 0010

## 2016-01-11 NOTE — ED Triage Notes (Signed)
Left lower back pain that radiates into buttock that began Friday. Denies injury.

## 2016-05-25 ENCOUNTER — Ambulatory Visit: Payer: BLUE CROSS/BLUE SHIELD | Attending: Otolaryngology

## 2016-05-25 DIAGNOSIS — G4733 Obstructive sleep apnea (adult) (pediatric): Secondary | ICD-10-CM | POA: Diagnosis not present

## 2016-05-25 DIAGNOSIS — R0683 Snoring: Secondary | ICD-10-CM | POA: Insufficient documentation

## 2017-02-01 ENCOUNTER — Emergency Department
Admission: EM | Admit: 2017-02-01 | Discharge: 2017-02-01 | Disposition: A | Discharge status: Home / Self Care | Payer: Self-pay | Attending: Emergency Medicine | Admitting: Emergency Medicine

## 2017-02-01 ENCOUNTER — Emergency Department: Payer: Self-pay

## 2017-02-01 ENCOUNTER — Encounter: Payer: Self-pay | Admitting: Emergency Medicine

## 2017-02-01 ENCOUNTER — Other Ambulatory Visit: Payer: Self-pay

## 2017-02-01 DIAGNOSIS — M25462 Effusion, left knee: Secondary | ICD-10-CM | POA: Insufficient documentation

## 2017-02-01 DIAGNOSIS — M25562 Pain in left knee: Secondary | ICD-10-CM | POA: Insufficient documentation

## 2017-02-01 MED ORDER — NAPROXEN 500 MG PO TABS: 500.0000 mg | ORAL_TABLET | Freq: Two times a day (BID) | ORAL | 0 refills | Status: AC

## 2017-02-01 MED ORDER — TRAMADOL HCL 50 MG PO TABS: 50.0000 mg | ORAL_TABLET | Freq: Four times a day (QID) | ORAL | 0 refills | Status: DC | PRN

## 2017-02-01 NOTE — Discharge Instructions (Signed)
Wear knee immobilizer when walking. You  may not be able to wear this while driving. Begin taking naproxen 500 mg twice a day with food. Tramadol if needed for moderate pain. Tramadol cannot be taken while driving as it may increase your risk for drowsiness. Follow-up with your doctor or orthopedist in [place].

## 2017-02-01 NOTE — ED Notes (Signed)
See triage note  States he developed pain to left knee yesterday denies any injury  States pain is mainly lateral  Increased pain with standing  Also noted some min swelling

## 2017-02-01 NOTE — ED Triage Notes (Signed)
L knee pain began yesterday. Denies injury. States has had some knee pain before but not this severe.

## 2017-02-01 NOTE — ED Provider Notes (Signed)
[facility] Emergency Department Provider Note  ____________________________________________   First MD Initiated Contact with Patient 02/01/17 1026     (approximate)  I have reviewed the triage vital signs and the nursing notes.   HISTORY  Chief Complaint Knee Pain   HPI Steven Yoder is a 32 y.o. male is here with history of left knee pain that began yesterday. Patient denies any injury. Patient states he took Tylenol last evening without any improvement. Currently [place] in [place] and had planned to go back to [place] today.he rates his pain is 9/10.   Past Medical History:  Diagnosis Date  . Depression     There are no active problems to display for this patient.   History reviewed. No pertinent surgical history.  Prior to Admission medications   Medication Sig Start Date End Date Taking? Authorizing Provider  naproxen (NAPROSYN) 500 MG tablet Take 1 tablet (500 mg total) by mouth 2 (two) times daily with a meal. 02/01/17   Tommi RumpsSummers, [place] Sarinana L, PA-C  traMADol (ULTRAM) 50 MG tablet Take 1 tablet (50 mg total) by mouth every 6 (six) hours as needed for moderate pain. 02/01/17   Tommi RumpsSummers, Dyneshia Baccam L, PA-C    Allergies Patient has no known allergies.  No family history on file.  Social History Social History   Tobacco Use  . Smoking status: Never Smoker  Substance Use Topics  . Alcohol use: No  . Drug use: Not on file    Review of Systems Constitutional: No fever/chills Cardiovascular: Denies chest pain. Respiratory: Denies shortness of breath. Gastrointestinal:   No nausea, no vomiting.  Musculoskeletal: positive left knee pain. Skin: Negative for rash. Neurological: Negative for  focal weakness or numbness. ____________________________________________   PHYSICAL EXAM:  VITAL SIGNS: ED Triage Vitals  Enc Vitals Group     BP 02/01/17 0956 133/81     Pulse Rate 02/01/17 0956 93     Resp 02/01/17 0956 20     Temp  02/01/17 0956 98.4 F (36.9 C)     Temp Source 02/01/17 0956 Oral     SpO2 02/01/17 0956 99 %     Weight 02/01/17 0957 280 lb (127 kg)     Height 02/01/17 0957 6\' 3"  (1.905 m)     Head Circumference --      Peak Flow --      Pain Score 02/01/17 0956 9     Pain Loc --      Pain Edu? --      Excl. in GC? --    Constitutional: Alert and oriented. Well appearing and in no acute distress. Eyes: Conjunctivae are normal.  Head: Atraumatic. Neck: No stridor.   Cardiovascular:   Good peripheral circulation. Respiratory: Normal respiratory effort.   Musculoskeletal: examination of the left knee does not show any gross deformity. There is some minimal soft tissue swelling present but no effusion. There is moderate tenderness on palpation of the medial aspect. Range of motion is limited secondary to pain but patient is able to stand and walk without assistance. Neurologic:  Normal speech and language. No gross focal neurologic deficits are appreciated. Skin:  Skin is warm, dry and intact. No rash noted. Psychiatric: Mood and affect are normal. Speech and behavior are normal.  ____________________________________________   LABS (all labs ordered are listed, but only abnormal results are displayed)  Labs Reviewed - No data to display  RADIOLOGY  Dg Knee Complete 4 Views Left  Result Date:  02/01/2017 CLINICAL DATA:  Left knee pain since yesterday.  No specific injury. EXAM: LEFT KNEE - COMPLETE 4+ VIEW COMPARISON:  None. FINDINGS: The joint spaces are maintained. No fracture or osteochondral lesion. There is a small joint effusion noted. IMPRESSION: No acute bony findings or degenerative changes. Small joint effusion. Electronically Signed   By: Rudie Meyer M.D.   On: 02/01/2017 10:58    ____________________________________________   PROCEDURES  Procedure(s) performed: None  Procedures  Critical Care performed: No  ____________________________________________   INITIAL  IMPRESSION / ASSESSMENT AND PLAN / ED COURSE Patient was placed in knee immobilizer and told to follow-up with his PCP in [place] or an orthopedist. Patient is to ice and elevate to reduce swelling. He was given a prescription for naproxen 500 mg twice a day with food and tramadol 50 mg every 6 hours due to pain. He is aware that he should not take the tramadol while driving back to [place].  ____________________________________________   FINAL CLINICAL IMPRESSION(S) / ED DIAGNOSES  Final diagnoses:  Acute pain of left knee  Effusion of left knee     ED Discharge Orders        Ordered    naproxen (NAPROSYN) 500 MG tablet  2 times daily with meals     02/01/17 1117    traMADol (ULTRAM) 50 MG tablet  Every 6 hours PRN     02/01/17 1117       Note:  This document was prepared using Dragon voice recognition software and may include unintentional dictation errors.    Tommi Rumps, PA-C 02/01/17 1348    Minna Antis, MD 02/01/17 1408

## 2018-03-20 ENCOUNTER — Encounter: Payer: Self-pay | Admitting: Emergency Medicine

## 2018-03-20 ENCOUNTER — Emergency Department
Admission: EM | Admit: 2018-03-20 | Discharge: 2018-03-20 | Disposition: A | Discharge status: Home / Self Care | Payer: Self-pay | Attending: Emergency Medicine | Admitting: Emergency Medicine

## 2018-03-20 ENCOUNTER — Emergency Department: Payer: Self-pay

## 2018-03-20 DIAGNOSIS — M25512 Pain in left shoulder: Secondary | ICD-10-CM | POA: Insufficient documentation

## 2018-03-20 DIAGNOSIS — F17220 Nicotine dependence, chewing tobacco, uncomplicated: Secondary | ICD-10-CM | POA: Insufficient documentation

## 2018-03-20 MED ORDER — IBUPROFEN 600 MG PO TABS
600.0000 mg | ORAL_TABLET | Freq: Once | ORAL | Status: AC
Start: 2018-03-20 — End: 2018-03-20
  Administered 2018-03-20: 600 mg via ORAL
  Filled 2018-03-20: qty 1

## 2018-03-20 MED ORDER — IBUPROFEN 600 MG PO TABS: 600.0000 mg | ORAL_TABLET | Freq: Four times a day (QID) | ORAL | 0 refills | Status: AC | PRN

## 2018-03-20 MED ORDER — TRAMADOL HCL 50 MG PO TABS: 50.0000 mg | ORAL_TABLET | Freq: Four times a day (QID) | ORAL | 0 refills | Status: AC | PRN

## 2018-03-20 NOTE — ED Triage Notes (Signed)
Patient with complaint of left shoulder pain times one week that has become worse. Patient states that he does not recall any injury.

## 2018-03-20 NOTE — Discharge Instructions (Addendum)
Make an appointment to follow-up with orthopedist within the next 1 to 2 weeks.  Take the ibuprofen as the primary medication for pain and to decrease inflammation.  You should take it every 6 hours (with food) over the next several days whether or not you are having significant pain at that time.  You can take the tramadol as needed for more severe pain.  Return to the ER for new or worsening pain, weakness or numbness, or any other new or worsening symptoms that concern you.

## 2018-03-20 NOTE — ED Provider Notes (Signed)
[facility] Emergency Department Provider Note ____________________________________________   First MD Initiated Contact with Patient 03/20/18 [id number]     (approximate)  I have reviewed the triage vital signs and the nursing notes.   HISTORY  Chief Complaint Shoulder Pain    HPI Steven Yoder is a 33 y.o. male with PMH as noted below who presents with left shoulder pain, acute onset over the last few days, not associated with any specific injury.  He states that it is worse when he tries to move it, and he has difficulty lifting the arm.  He denies any numbness or weakness.  The patient states that he has had similar pain flare up in the past but has not had it evaluated before.  Past Medical History:  Diagnosis Date  . Depression     There are no active problems to display for this patient.   Past Surgical History:  Procedure Laterality Date  . TONSILLECTOMY      Prior to Admission medications   Medication Sig Start Date End Date Taking? Authorizing Provider  ibuprofen (ADVIL,MOTRIN) 600 MG tablet Take 1 tablet (600 mg total) by mouth every 6 (six) hours as needed. 03/20/18   Dionne Bucy, MD  naproxen (NAPROSYN) 500 MG tablet Take 1 tablet (500 mg total) by mouth 2 (two) times daily with a meal. 02/01/17   Tommi Rumps, PA-C  traMADol (ULTRAM) 50 MG tablet Take 1 tablet (50 mg total) by mouth every 6 (six) hours as needed for up to 7 days. 03/20/18 03/27/18  Dionne Bucy, MD    Allergies Patient has no known allergies.  No family history on file.  Social History Social History   Tobacco Use  . Smoking status: Never Smoker  . Smokeless tobacco: Current User  Substance Use Topics  . Alcohol use: Yes  . Drug use: Not on file    Review of Systems  Constitutional: No fever. ENT: No neck pain. Cardiovascular: Denies chest pain. Musculoskeletal: Positive for shoulder pain. Skin: Negative for rash. Neurological: Negative for  focal weakness or numbness.   ____________________________________________   PHYSICAL EXAM:  VITAL SIGNS: ED Triage Vitals  Enc Vitals Group     BP 03/20/18 0325 133/86     Pulse Rate 03/20/18 0325 67     Resp 03/20/18 0325 18     Temp 03/20/18 0325 97.7 F (36.5 C)     Temp Source 03/20/18 0325 Oral     SpO2 03/20/18 0325 99 %     Weight 03/20/18 0321 295 lb (133.8 kg)     Height 03/20/18 0321 6\' 1"  (1.854 m)     Head Circumference --      Peak Flow --      Pain Score 03/20/18 0321 10     Pain Loc --      Pain Edu? --      Excl. in GC? --     Constitutional: Alert and oriented. Well appearing and in no acute distress. Eyes: Conjunctivae are normal.  Head: Atraumatic. Nose: No congestion/rhinnorhea. Mouth/Throat: Mucous membranes are moist.   Neck: Normal range of motion.  Cardiovascular: Normal rate, regular rhythm. Good peripheral circulation. Respiratory: Normal respiratory effort. Gastrointestinal: No distention.  Musculoskeletal: Extremities warm and well perfused.  Left shoulder with mild tenderness anteriorly and superiorly.  No deformity.  Pain on abduction.  2+ radial pulse. Neurologic:  Normal speech and language. No gross focal neurologic deficits are appreciated.  Left arm with median/radial/ulnar motor and  sensory intact. Skin:  Skin is warm and dry. No rash noted. Psychiatric: Mood and affect are normal. Speech and behavior are normal.  ____________________________________________   LABS (all labs ordered are listed, but only abnormal results are displayed)  Labs Reviewed - No data to display ____________________________________________  EKG   ____________________________________________  RADIOLOGY  XR L shoulder: No acute abnormality  ____________________________________________   PROCEDURES  Procedure(s) performed: No  Procedures  Critical Care performed: No ____________________________________________   INITIAL IMPRESSION /  ASSESSMENT AND PLAN / ED COURSE  Pertinent labs & imaging results that were available during my care of the patient were reviewed by me and considered in my medical decision making (see chart for details).  33 year old male presents with atraumatic left shoulder pain.  The patient states that he has had a few exacerbations of shoulder pain like this in the past.  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  On exam he is overall well-appearing with normal vital signs.  He has some tenderness to the shoulder but no significant swelling.  He has pain on abduction.  The left upper extremity is neuro/vascular intact.  X-ray obtained from triage is negative.  Overall I suspect most likely rotator cuff or other soft tissue injury.  There is no indication for further ED work-up.  I will prescribe the patient NSAIDs and tramadol, a sling for comfort (although I instructed the patient on range of motion exercises to prevent stiffness) and orthopedic referral.  Return precautions given, and he expresses understanding.   ____________________________________________   FINAL CLINICAL IMPRESSION(S) / ED DIAGNOSES  Final diagnoses:  Acute pain of left shoulder      NEW MEDICATIONS STARTED DURING THIS VISIT:  New Prescriptions   IBUPROFEN (ADVIL,MOTRIN) 600 MG TABLET    Take 1 tablet (600 mg total) by mouth every 6 (six) hours as needed.   TRAMADOL (ULTRAM) 50 MG TABLET    Take 1 tablet (50 mg total) by mouth every 6 (six) hours as needed for up to 7 days.     Note:  This document was prepared using Dragon voice recognition software and may include unintentional dictation errors.   Dionne Bucy, MD 03/20/18 0600
